# Patient Record
Sex: Female | Born: 2005
Health system: Southern US, Community
[De-identification: ages and names within clinical notes are randomized; demographics above are authoritative.]

## PROBLEM LIST (undated history)

## (undated) DIAGNOSIS — J189 Pneumonia, unspecified organism: Secondary | ICD-10-CM

## (undated) HISTORY — DX: Pneumonia, unspecified organism: J18.9

---
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
## 2023-01-13 ENCOUNTER — Ambulatory Visit (INDEPENDENT_AMBULATORY_CARE_PROVIDER_SITE_OTHER): Payer: BC Managed Care – PPO | Admitting: Obstetrics & Gynecology

## 2023-01-13 ENCOUNTER — Encounter: Payer: Self-pay | Admitting: Obstetrics & Gynecology

## 2023-01-13 VITALS — BP 117/79 | HR 88 | Ht 68.0 in | Wt 172.0 lb

## 2023-01-13 DIAGNOSIS — Z1339 Encounter for screening examination for other mental health and behavioral disorders: Secondary | ICD-10-CM

## 2023-01-13 DIAGNOSIS — Z7185 Encounter for immunization safety counseling: Secondary | ICD-10-CM

## 2023-01-13 DIAGNOSIS — Z3041 Encounter for surveillance of contraceptive pills: Secondary | ICD-10-CM

## 2023-01-13 MED ORDER — NORGESTIMATE-ETH ESTRADIOL 0.25-35 MG-MCG PO TABS: 1.0000 | ORAL_TABLET | Freq: Every day | ORAL | 4 refills | Status: DC

## 2023-01-13 NOTE — Progress Notes (Signed)
   GYNECOLOGY OFFICE VISIT NOTE  History:   Melissa Brock is a 17 y.o. G0P0000 here today for refill of her OCPs. Uses condoms for STI prophylaxis. Declines STI screen. She denies any abnormal vaginal discharge, bleeding, pelvic pain or other concerns.    Past Medical History:  Diagnosis Date   Episodic tension-type headache, not intractable 09/14/2015   Migraine without aura and without status migrainosus, not intractable 06/15/2015   Molluscum contagiosum 03/13/2009   Vasovagal syncope 06/15/2015   Walking pneumonia 01/22/2007    History reviewed. No pertinent surgical history.  The following portions of the patient's history were reviewed and updated as appropriate: allergies, current medications, past family history, past medical history, past social history, past surgical history and problem list.   Health Maintenance:  Has not received HPV vaccine series   Review of Systems:  Pertinent items noted in HPI and remainder of comprehensive ROS otherwise negative.  Physical Exam:  BP 117/79   Pulse 88   Ht 5\' 8"  (1.727 m)   Wt 172 lb (78 kg)   LMP 12/22/2022   BMI 26.15 kg/m  CONSTITUTIONAL: Well-developed, well-nourished female in no acute distress.  HEENT:  Normocephalic, atraumatic. External right and left ear normal. No scleral icterus.  NECK: Normal range of motion, supple, no masses noted on observation SKIN: No rash noted. Not diaphoretic. No erythema. No pallor. MUSCULOSKELETAL: Normal range of motion. No edema noted. NEUROLOGIC: Alert and oriented to person, place, and time. Normal muscle tone coordination. No cranial nerve deficit noted. PSYCHIATRIC: Normal mood and affect. Normal behavior. Normal judgment and thought content. CARDIOVASCULAR: Normal heart rate noted RESPIRATORY: Effort and breath sounds normal, no problems with respiration noted ABDOMEN: No masses noted. No other overt distention noted.   PELVIC: Deferred     Assessment and Plan:     1. HPV  vaccine counseling Counseled about Gardasil, handout given to her. She will make decision soon.   2. Oral contraceptive pill surveillance (Primary) OCPs refilled, recommended to continue condoms for STI prevention. - norgestimate-ethinyl estradiol (ORTHO-CYCLEN) 0.25-35 MG-MCG tablet; Take 1 tablet by mouth daily.  Dispense: 84 tablet; Refill: 4   Routine preventative health maintenance measures emphasized. Please refer to After Visit Summary for other counseling recommendations.   Return for any gynecologic concerns.    I spent 25 minutes dedicated to the care of this patient including pre-visit review of records, face to face time with the patient discussing her conditions and treatments and post visit orders.    Jaynie Collins, MD, FACOG Obstetrician & Gynecologist, [facility] for [employer], [facility]

## 2024-01-19 ENCOUNTER — Ambulatory Visit: Admitting: Obstetrics and Gynecology

## 2024-01-21 ENCOUNTER — Ambulatory Visit (INDEPENDENT_AMBULATORY_CARE_PROVIDER_SITE_OTHER): Admitting: Obstetrics and Gynecology

## 2024-01-21 ENCOUNTER — Encounter: Payer: Self-pay | Admitting: Obstetrics and Gynecology

## 2024-01-21 DIAGNOSIS — Z3041 Encounter for surveillance of contraceptive pills: Secondary | ICD-10-CM | POA: Diagnosis not present

## 2024-01-21 MED ORDER — NORGESTIMATE-ETH ESTRADIOL 0.25-35 MG-MCG PO TABS: 1.0000 | ORAL_TABLET | Freq: Every day | ORAL | 11 refills | Status: AC

## 2024-01-21 NOTE — Progress Notes (Signed)
" ° °  WELL-WOMAN PHYSICAL Patient name: Melissa Brock MRN [id number]  Date of birth: 21-Apr-2005 Chief Complaint:   Oral contraceptive pill surveillance  History of Present Illness:   Melissa Brock is a 18 y.o. G0P0000 Caucasian female being seen today for a routine well-woman exam.  Current complaints: here for OCP refill  PCP: Emilio Kelly DASEN, FNP (Inactive)      does not desire labs No LMP recorded. The current method of family planning is OCP (estrogen/progesterone).  Last pap n/a.  Last mammogram: n/a. Family h/o breast cancer: unsure Last colonoscopy: n/a. Family h/o colorectal cancer: unsure Review of Systems:   Pertinent items are noted in HPI Denies any headaches, blurred vision, fatigue, shortness of breath, chest pain, abdominal pain, abnormal vaginal discharge/itching/odor/irritation, problems with periods, bowel movements, urination, or intercourse unless otherwise stated above. Pertinent History Reviewed:  Reviewed past medical,surgical, social and family history.  Reviewed problem list, medications and allergies. Physical Assessment:   Vitals:   01/21/24 1616  BP: 120/74  Pulse: 85  Weight: 168 lb (76.2 kg)  Body mass index is 25.54 kg/m.        Physical Examination:   General appearance - well appearing, and in no distress  Mental status - alert, oriented to person, place, and time  Psych:  She has a normal mood and affect  Skin - warm and dry, normal color, no suspicious lesions noted  Chest - effort normal, all lung fields clear to auscultation bilaterally  Heart - normal rate and regular rhythm  Neck:  midline trachea, no thyromegaly or nodules  Breasts - not examined  Abdomen - not examined  Pelvic - deferred  Rectal - not examined  Extremities:  No swelling or varicosities noted  No results found for this or any previous visit (from the past 24 hours).  Assessment & Plan:  1. Oral contraceptive pill surveillance - Prescription for: norgestimate -ethinyl  estradiol  (ORTHO-CYCLEN) 0.25-35 MG-MCG tablet; Take 1 tablet by mouth daily.  Dispense: 84 tablet; Refill: 11   Labs/procedures today: none  Mammogram at age 22 or sooner if problems Colonoscopy at age 2 or sooner if problems  No orders of the defined types were placed in this encounter.   Meds:  Meds ordered this encounter  Medications   norgestimate -ethinyl estradiol  (ORTHO-CYCLEN) 0.25-35 MG-MCG tablet    Sig: Take 1 tablet by mouth daily.    Dispense:  84 tablet    Refill:  11    Follow-up: Return in about 1 year (around 01/20/2025) for Annual Exam with Pap.  Total face-to-face time spent during this encounter was 5 minutes. There was 5 minutes of chart review time spent prior to this encounter. Total time spent = 10 minutes.    Theotis Gerdeman MSN, CNM 01/21/2024 4:39 PM "
